# Patient Record
Sex: Female | Born: 1970 | Hispanic: Yes | Marital: Married | Smoking: Never smoker
Health system: Southern US, Community
[De-identification: ages and names within clinical notes are randomized; demographics above are authoritative.]

## PROBLEM LIST (undated history)

## (undated) HISTORY — PX: HAND SURGERY: SHX662

---

## 2003-12-01 ENCOUNTER — Emergency Department: Payer: Self-pay | Admitting: Emergency Medicine

## 2006-03-22 ENCOUNTER — Inpatient Hospital Stay: Payer: Self-pay | Admitting: Internal Medicine

## 2006-03-23 IMAGING — CT CT HEAD WITHOUT CONTRAST
2 series · 16 of 30 positions shown, 20 images · non-contrast
Comparison: none

REASON FOR EXAM: unresponsive
COMMENTS:

[Series 2: without · axial · non-contrast · 0.41mm/px · z∈[-170,-50]mm · 13 of 29 slices shown, 17 images]
[im 3/29  brain]
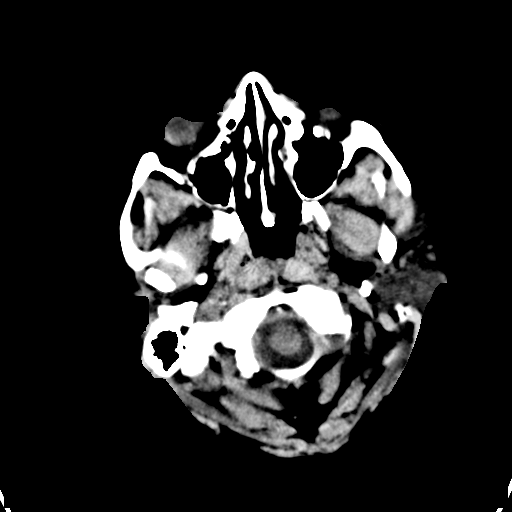
[im 3/29  bone]
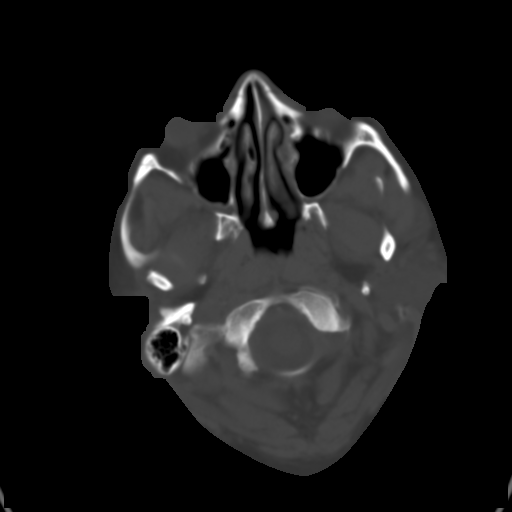
[im 5/29  brain]
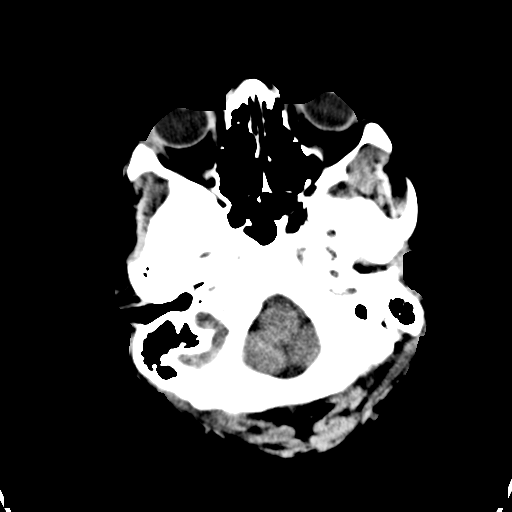
[im 7/29  brain]
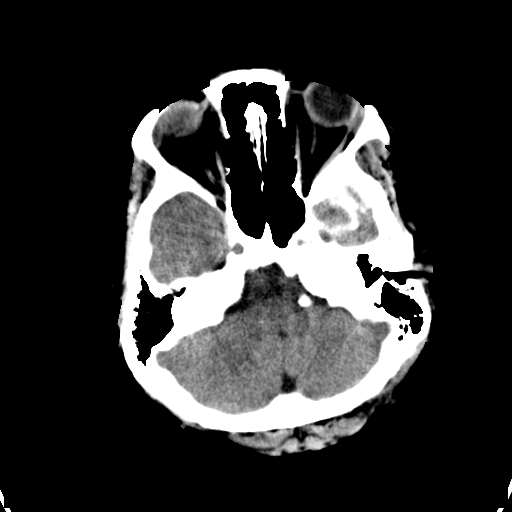
[im 9/29  brain]
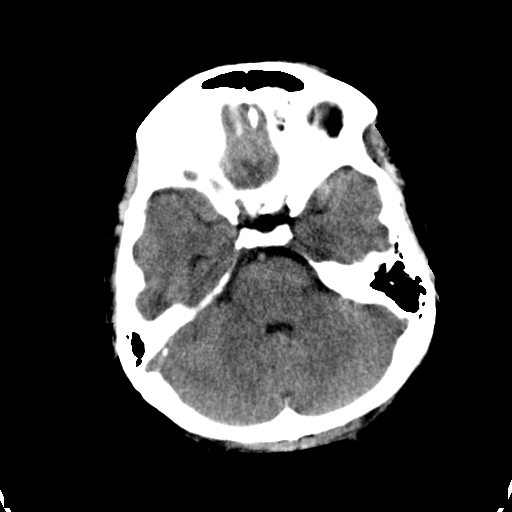
[im 11/29  brain]
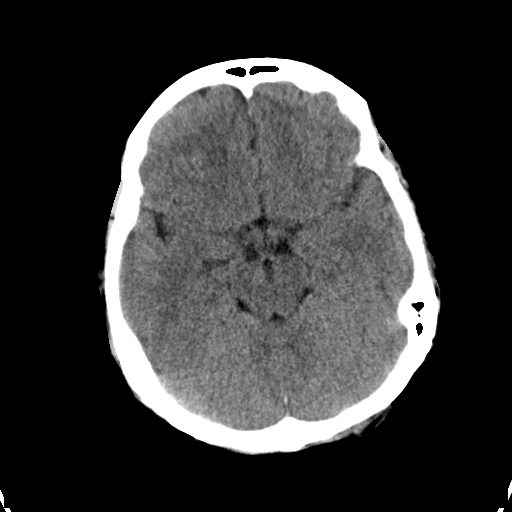
[im 11/29  bone]
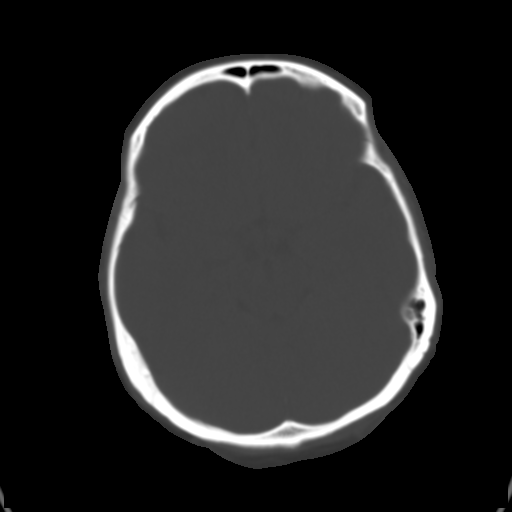
[im 13/29  brain]
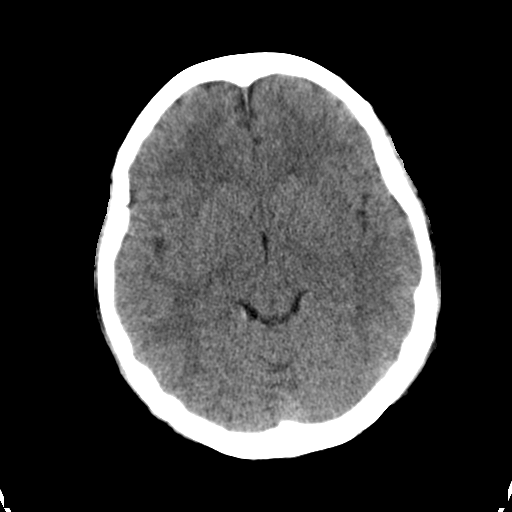
[im 15/29  brain]
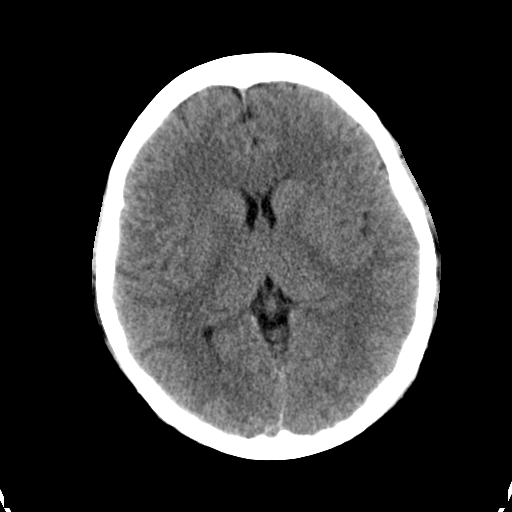
[im 17/29  brain]
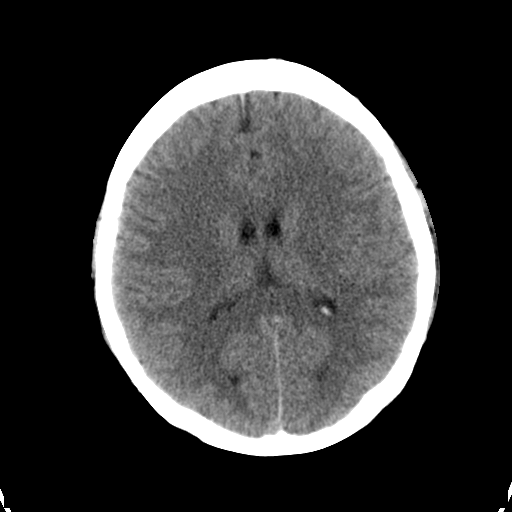
[im 19/29  brain]
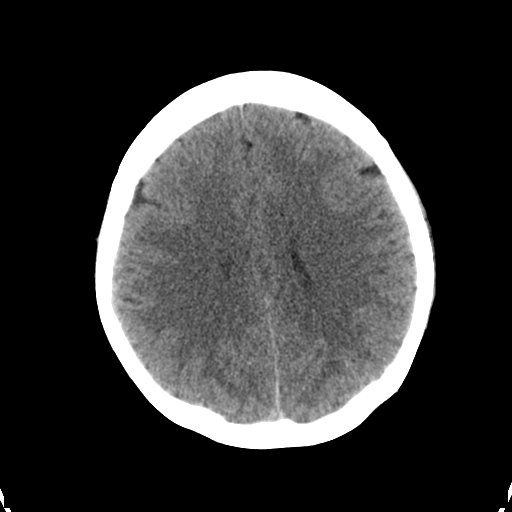
[im 19/29  bone]
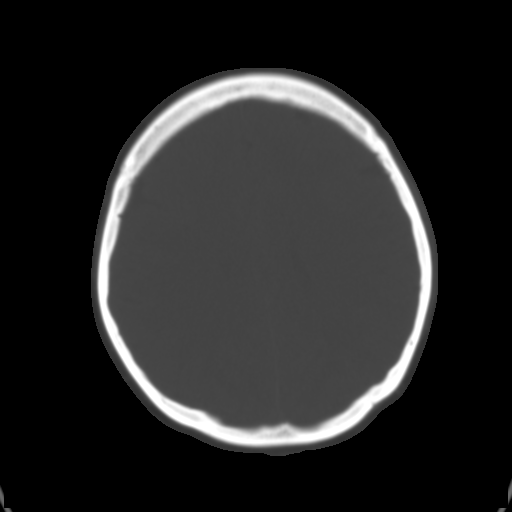
[im 21/29  brain]
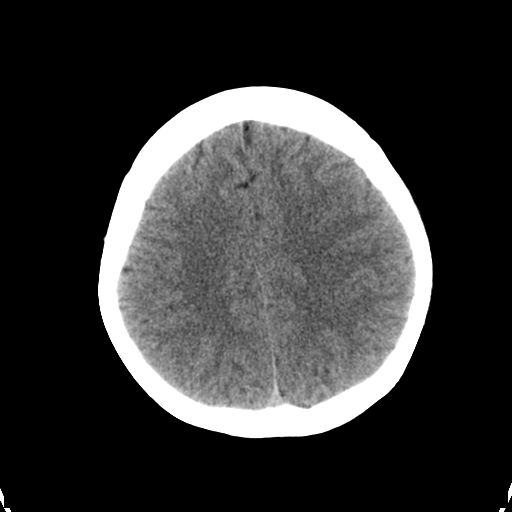
[im 23/29  brain]
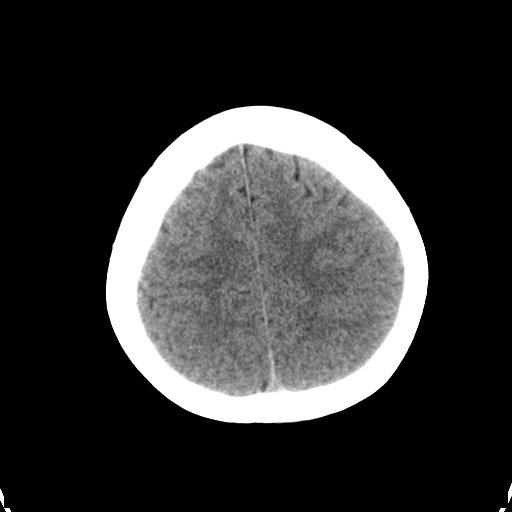
[im 25/29  brain]
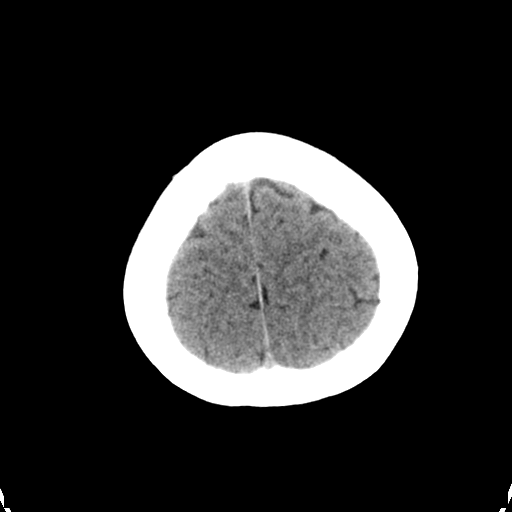
[im 27/29  brain]
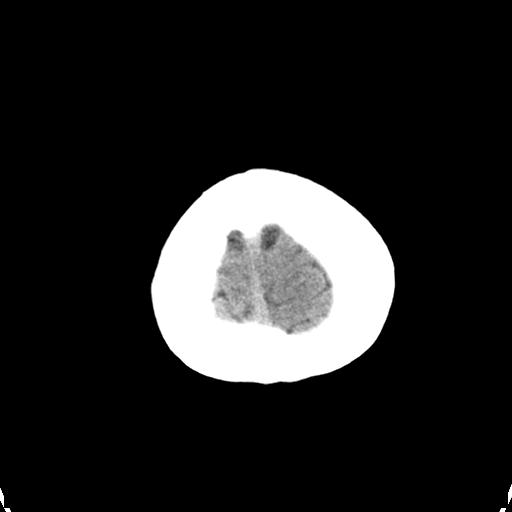
[im 27/29  bone]
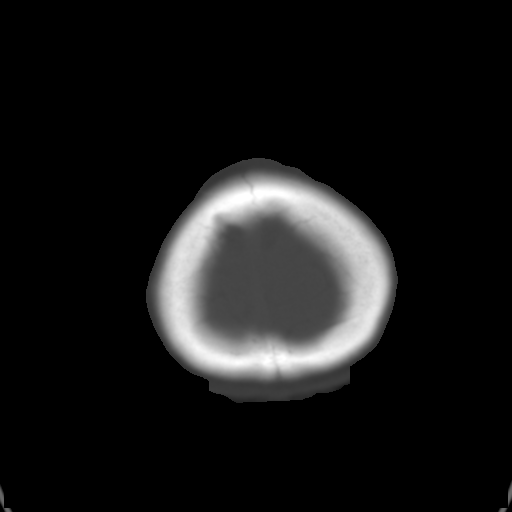

[Series 3: bone · axial · 0.41mm/px · z∈[-170,-130]mm · 3 of 29 slices shown]
[im 3/29  bone]
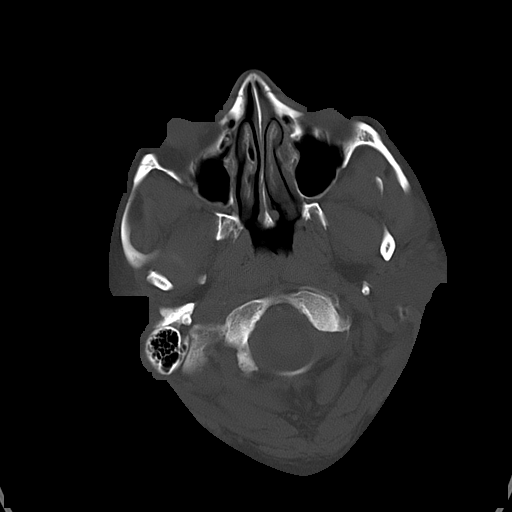
[im 7/29  bone]
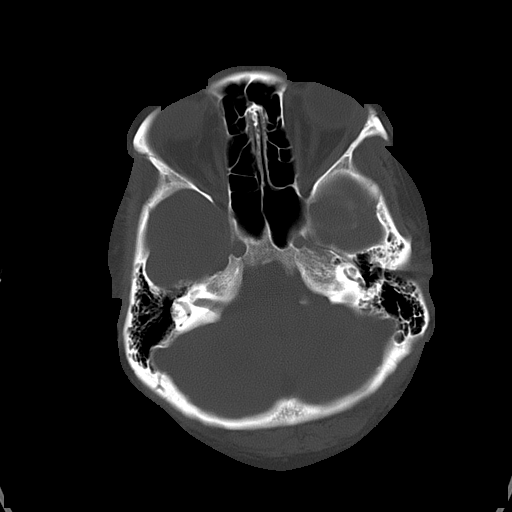
[im 11/29  bone]
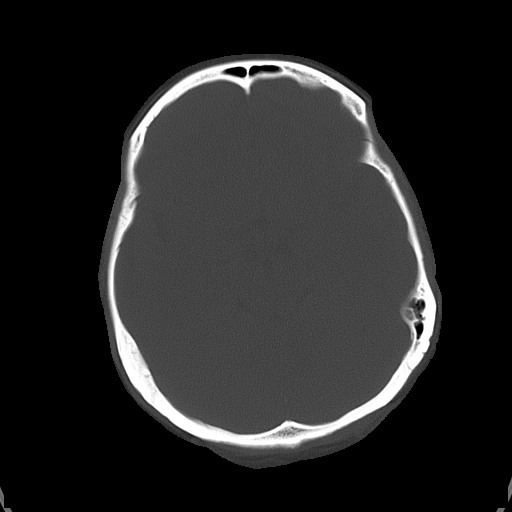

[16 of 30 positions shown; findings below may reference images not displayed]

PROCEDURE:     CT  - CT HEAD WITHOUT CONTRAST  - [DATE]  [DATE]

RESULT:       The patient is experiencing altered mental status.

The ventricles are normal in size and position. There is no intracranial
hemorrhage, mass, or mass-effect.  At bone window settings, I do not see
evidence of an acute skull fracture. There is a small amount of
mucoperiosteal thickening in the RIGHT  maxillary sinus.
IMPRESSION: I see no acute intracranial abnormality.

A preliminary report was sent to the [HOSPITAL] the conclusion
study.

## 2008-05-21 ENCOUNTER — Encounter: Payer: Self-pay | Admitting: Maternal and Fetal Medicine

## 2008-08-24 ENCOUNTER — Ambulatory Visit: Payer: Self-pay | Admitting: Certified Nurse Midwife

## 2008-09-01 ENCOUNTER — Observation Stay: Payer: Self-pay | Admitting: Emergency Medicine

## 2008-09-23 ENCOUNTER — Ambulatory Visit: Payer: Self-pay | Admitting: Certified Nurse Midwife

## 2008-10-16 ENCOUNTER — Observation Stay: Payer: Self-pay

## 2008-10-17 ENCOUNTER — Inpatient Hospital Stay: Payer: Self-pay

## 2013-09-01 ENCOUNTER — Ambulatory Visit: Payer: Self-pay | Admitting: Specialist

## 2013-09-12 ENCOUNTER — Ambulatory Visit: Payer: Self-pay | Admitting: Specialist

## 2014-05-16 NOTE — Op Note (Signed)
PATIENT NAME:  Ashley Rangel, [name] MR#:  [id number] DATE OF BIRTH:  1970/07/22  DATE OF PROCEDURE:  09/12/2013  PREOPERATIVE DIAGNOSIS: Right carpal tunnel syndrome.   POSTOPERATIVE DIAGNOSIS: Right carpal tunnel syndrome.   PROCEDURE: Right carpal tunnel release.   SURGEON: Myra Rudehristopher Kentrell Hallahan, M.D.   ANESTHESIA: IV regional.   COMPLICATIONS: None.   TOURNIQUET TIME: Approximately 35 minutes.   DESCRIPTION OF PROCEDURE: After adequate induction of IV regional anesthesia for the right upper extremity, the right upper extremity is thoroughly prepped with alcohol and ChloraPrep and draped in standard sterile fashion. Under loupe magnification, a standard volar carpal tunnel incision is made and the dissection carried down to the transverse retinacular ligament. This is incised in the midportion with the knife. The distal release is performed with the small scissors. The proximal release is performed with the small scissors and the carpal tunnel scissors. Careful check is made both proximally and distally to ensure that complete release had been obtained. There is no mass lesion present. There is moderate compression of the nerve directly beneath the ligament. The wound is thoroughly irrigated multiple times. Skin edges are infiltrated with 0.5% plain Marcaine. The skin is closed with 4-0 nylon. A soft bulky dressing is applied. The tourniquet is released and the patient is returned to the recovery room in satisfactory condition having tolerated the procedure quite well. ____________________________ Clare Gandyhristopher E. Cyndee Giammarco, MD ces:sb D: 09/12/2013 08:48:19 ET T: 09/12/2013 11:35:17 ET JOB#: [id number]  cc: Clare Gandyhristopher E. Shaquavia Whisonant, MD, <Dictator> Clare GandyHRISTOPHER E Navon Kotowski MD ELECTRONICALLY SIGNED 09/17/2013 16:49

## 2014-11-09 ENCOUNTER — Ambulatory Visit: Payer: Self-pay

## 2014-11-16 ENCOUNTER — Ambulatory Visit: Payer: Self-pay

## 2014-12-02 ENCOUNTER — Ambulatory Visit
Admission: RE | Admit: 2014-12-02 | Discharge: 2014-12-02 | Disposition: A | Discharge status: Home / Self Care | Payer: Self-pay | Source: Ambulatory Visit | Attending: Oncology | Admitting: Oncology

## 2014-12-02 ENCOUNTER — Ambulatory Visit: Payer: Self-pay | Attending: Oncology

## 2014-12-02 VITALS — BP 105/70 | HR 70 | Temp 96.6°F | Resp 16 | Ht 60.63 in | Wt 152.9 lb

## 2014-12-02 DIAGNOSIS — N63 Unspecified lump in unspecified breast: Secondary | ICD-10-CM

## 2014-12-02 NOTE — Progress Notes (Signed)
Subjective:     Patient ID: Ashley Rangel, female   DOB: 03/07/1970, 44 y.o.   MRN: [id number]  HPI   Review of Systems     Objective:   Physical Exam  Pulmonary/Chest: Right breast exhibits no inverted nipple, no mass, no nipple discharge, no skin change and no tenderness. Left breast exhibits no inverted nipple, no mass, no nipple discharge, no skin change and no tenderness. Breasts are symmetrical.  Bilateral thickenings upper outer quadrants.       Assessment:    44 year old hispanic [facility] complaint of left breast pain at 3 o'clock .Patient screened, and meets [facility] eligibility.  Patient does not have insurance, Medicare or Medicaid.  Handout given on Affordable Care Act.  Instructed patient on breast self-exam using teach back method.  Clinical breast exam reveals bilateral thickenings upper outer quadrants.  Patient identifies targeted pain at 3 o'clock.  She has no previous mammogram.  She does state she drinks caffeine "morning, noon, and night".  Discussed weaning self off caffeine, and recommendations for breast pain.      Plan:     Sent for bilateral diagnostic mammogram, and bilateral ultrasound.

## 2014-12-08 NOTE — Progress Notes (Signed)
Letter mailed from [facility] to notify of normal mammogram results.  Patient to return in one year for annual screening.  Copy to [facility]. 

## 2015-02-06 ENCOUNTER — Encounter: Payer: Self-pay | Admitting: Intensive Care

## 2015-02-06 ENCOUNTER — Emergency Department
Admission: EM | Admit: 2015-02-06 | Discharge: 2015-02-06 | Disposition: A | Discharge status: Home / Self Care | Payer: Worker's Compensation | Attending: Emergency Medicine | Admitting: Emergency Medicine

## 2015-02-06 DIAGNOSIS — S0501XA Injury of conjunctiva and corneal abrasion without foreign body, right eye, initial encounter: Secondary | ICD-10-CM | POA: Insufficient documentation

## 2015-02-06 DIAGNOSIS — Y9289 Other specified places as the place of occurrence of the external cause: Secondary | ICD-10-CM | POA: Insufficient documentation

## 2015-02-06 DIAGNOSIS — X131XXA Other contact with steam and other hot vapors, initial encounter: Secondary | ICD-10-CM | POA: Insufficient documentation

## 2015-02-06 DIAGNOSIS — S058X1A Other injuries of right eye and orbit, initial encounter: Secondary | ICD-10-CM

## 2015-02-06 DIAGNOSIS — Y990 Civilian activity done for income or pay: Secondary | ICD-10-CM | POA: Insufficient documentation

## 2015-02-06 DIAGNOSIS — Y9389 Activity, other specified: Secondary | ICD-10-CM | POA: Insufficient documentation

## 2015-02-06 DIAGNOSIS — T2601XA Burn of right eyelid and periocular area, initial encounter: Secondary | ICD-10-CM | POA: Insufficient documentation

## 2015-02-06 MED ORDER — FLUORESCEIN SODIUM 1 MG OP STRP
1.0000 | ORAL_STRIP | Freq: Once | OPHTHALMIC | Status: AC
  Administered 2015-02-06: 1 via OPHTHALMIC
  Filled 2015-02-06: qty 1

## 2015-02-06 MED ORDER — GENTAMICIN SULFATE 0.3 % OP SOLN: 1.0000 [drp] | OPHTHALMIC | Status: AC

## 2015-02-06 MED ORDER — KETOROLAC TROMETHAMINE 0.5 % OP SOLN: 1.0000 [drp] | Freq: Four times a day (QID) | OPHTHALMIC | Status: AC

## 2015-02-06 MED ORDER — ERYTHROMYCIN 5 MG/GM OP OINT
1.0000 "application " | TOPICAL_OINTMENT | Freq: Four times a day (QID) | OPHTHALMIC | Status: DC
  Administered 2015-02-06: 1 via OPHTHALMIC
  Filled 2015-02-06: qty 1

## 2015-02-06 MED ORDER — TETRACAINE HCL 0.5 % OP SOLN
1.0000 [drp] | Freq: Once | OPHTHALMIC | Status: AC
  Administered 2015-02-06: 1 [drp] via OPHTHALMIC
  Filled 2015-02-06: qty 2

## 2015-02-06 NOTE — ED Notes (Signed)
Spoke with patient. Awaiting an interpreter.

## 2015-02-06 NOTE — Discharge Instructions (Signed)
Abrasin corneal (Corneal Abrasion) La crnea es la cubierta transparente en la parte anterior y central del ojo. Cuando mira la parte colorida del ojo (iris), est mirando a travs de la crnea. La crnea es un tejido muy delgado formado por varias capas. La capa superficial es una capa nica de clulas (epitelio corneal) y es uno de los tejidos ms sensibles del organismo. Si un rasguo o una lesin hacen que el epitelio corneal se desprenda, a esto se lo llama abrasin corneal. Si la lesin se extiende a los tejidos que se encuentran por debajo del epitelio, la afeccin se denomina lcera corneal. CAUSAS   Rasguos.  Traumatismos.  Cuerpo extrao en el ojo. Algunas personas tienen recurrencias de abrasiones en la zona de la lesin original incluso despus de que esta ha sanado (sndrome de erosin recurrente). El sndrome de erosin recurrente generalmente mejora y desaparece con el tiempo. SNTOMAS   Dolor en el ojo.  Dificultad o imposibilidad de Financial controllermantener abierto el ojo lesionado.  El ojo est muy sensible a la luz.  Las erosiones recurrentes tienden a ocurrir de Wellsite geologistmanera repentina, a primera hora de la maana, generalmente al despertar y abrir los ojos. DIAGNSTICO  Su mdico podr diagnosticar una abrasin corneal durante un examen ocular. Generalmente se coloca un tinte en el ojo, usando un gotero o una pequea tira de papel humedecida con sus lgrimas. Al examinar el ojo con una luz especial, aparece claramente la abrasin destacada por el tinte. TRATAMIENTO   Las abrasiones pequeas pueden tratarse con gotas o un ungento con antibitico.  Es posible que le apliquen un parche de presin sobre el ojo. Si este es [address] caso, siga las instrucciones de su mdico respecto de cundo Corporate treasurerretirar el parche. No conduzca ni opere maquinaria mientras lleve puesto el parche. Es difcil juzgar las Sales promotion account executivedistancias en estas condiciones. Si la abrasin se infecta y se disemina hacia tejidos ms profundos de  la crnea, puede producirse una lcera corneal. Esto es ms grave porque puede causar una cicatriz en la crnea. Las cicatrices en la crnea interfieren con el paso de la luz a travs de esta y causan prdida de la visin en el ojo involucrado. INSTRUCCIONES PARA EL CUIDADO EN EL HOGAR  Utilice los medicamentos o el ungento segn lo indicado. Utilice los medicamentos de venta libre o recetados para Primary school teachercalmar el dolor, el malestar o la fiebre, segn se lo indique el mdico.  No conduzca ni opere maquinarias mientras tenga el parche en el ojo. En estas condiciones no puede juzgar correctamente las distancias.  Si el mdico le ha dado fecha para una visita de control, es importante que concurra. No cumplir con las visitas de control puede dar como resultado una infeccin grave en el ojo o una prdida permanente de la visin. Si hay algn problema que le impide acudir a la cita, avsele a su mdico. SOLICITE ATENCIN MDICA SI:   Electronics engineeriente dolor, tiene sensibilidad a la luz y experimenta una sensacin de picazn en un ojo o en ambos.  El parche de presin se afloja continuamente y puede parpadear debajo del parche despus del tratamiento.  Aparece algn tipo de secrecin en el ojo despus del tratamiento o si despierta con los prpados pegados por la maana.  Por la Miniermaana, siente los mismos sntomas que sinti en los [address]  [address] en que tuvo la abrasin original, algunas semanas o meses despus de la curacin.   Esta informacin no tiene Theme park managercomo fin reemplazar el consejo del mdico. Asegrese de hacerle  al mdico cualquier pregunta que tenga.   Document Released: 01/09/2005 Document Revised: 09/30/2014 Elsevier Interactive Patient Education 2016 [employer].  Cuidado de las The Pepsiquemaduras  (Burn Care)  Judye BosUna quemadura lastima la piel. Cuando esto ocurre, es ms fcil que se produzca una infeccin. Siga las indicaciones de su mdico para evitar la infeccin.  CUIDADOS EN EL HOGAR   Lvese bien las manos antes de  cambiarse el vendaje.  Cambie el vendaje tal como le indic su mdico.  Retire el vendaje viejo. Si el vendaje se adhiere, remjelo con agua limpia y fra.  Higienice suavemente la Lao People's Democratic Republicquemadura con agua y Bel[place]jabn.  Squela dando palmaditas con un pao limpio y seco.  Aplique una capa delgada de crema con medicamento.  Coloque un vendaje limpio segn las indicaciones del mdico.  Mantenga el vendaje limpio y seco.  Mantenga la zona quemada elevada durante las primeras 24 horas. Luego, siga las indicaciones del mdico.  Slo tome la medicacin segn las indicaciones. SOLICITE AYUDA DE INMEDIATO SI:   Siente dolor intenso.  La piel que rodea la quemadura est roja, hinchada, le duele o hay rayas rojas  Aparece un lquido de color blanco amarillento (pus) en zona quemada, o sta tiene mal olor.  Tiene fiebre. ASEGRESE DE QUE:   Comprende estas instrucciones.  Controlar su enfermedad.  Solicitar ayuda de inmediato si no mejora o si empeora.   Esta informacin no tiene Theme park managercomo fin reemplazar el consejo del mdico. Asegrese de hacerle al mdico cualquier pregunta que tenga.   Document Released: 07/25/2010 Document Revised: 04/03/2011 Elsevier Interactive Patient Education [employer]2016 Elsevier Inc.  Use the eye drops as directed. Apply moisturizing cream to the face as needed for dryness and irritation. [facility] needed for follow-up.
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
## 2019-04-12 ENCOUNTER — Ambulatory Visit: Payer: Self-pay | Attending: Internal Medicine

## 2019-04-12 DIAGNOSIS — Z23 Encounter for immunization: Secondary | ICD-10-CM

## 2019-04-12 NOTE — Progress Notes (Signed)
   Covid-19 Vaccination Clinic  Name:  Ashley Rangel    MRN: [id number] DOB: [birth year]  04/12/2019  Ms. Ashley Rangel was observed post Covid-19 immunization for 15 minutes without incident. She was provided with Vaccine Information Sheet and instruction to access the V-Safe system.   Ms. Ashley Rangel was instructed to call 911 with any severe reactions post vaccine: Marland Kitchen Difficulty breathing  . Swelling of face and throat  . A fast heartbeat  . A bad rash all over body  . Dizziness and weakness   Immunizations Administered    Name Date Dose VIS Date Route   Pfizer COVID-19 Vaccine 04/12/2019  8:42 AM 0.3 mL 01/03/2019 Intramuscular   Manufacturer: [employer], [employer]   Lot: [id number]   NDC: [address]-1

## 2019-05-03 ENCOUNTER — Ambulatory Visit: Payer: Self-pay | Attending: Internal Medicine

## 2019-05-03 DIAGNOSIS — Z23 Encounter for immunization: Secondary | ICD-10-CM

## 2019-05-03 NOTE — Progress Notes (Signed)
   Covid-19 Vaccination Clinic  Name:  Ashley Rangel    MRN: [id number] DOB: 1970/01/30  05/03/2019  Ms. Ashley Rangel was observed post Covid-19 immunization for 15 minutes without incident. She was provided with Vaccine Information Sheet and instruction to access the V-Safe system.   Ms. Ashley Rangel was instructed to call 911 with any severe reactions post vaccine: Marland Kitchen Difficulty breathing  . Swelling of face and throat  . A fast heartbeat  . A bad rash all over body  . Dizziness and weakness   Immunizations Administered    Name Date Dose VIS Date Route   Pfizer COVID-19 Vaccine 05/03/2019  8:21 AM 0.3 mL 01/03/2019 Intramuscular   Manufacturer: [employer], [employer]   Lot: [id number]   NDC: [address]-3
